# Patient Record
Sex: Male | Born: 1955 | Race: White | Hispanic: No | Marital: Married | Smoking: Former smoker
Health system: Southern US, Community
[De-identification: ages and names within clinical notes are randomized; demographics above are authoritative.]

## PROBLEM LIST (undated history)

## (undated) DIAGNOSIS — M4800 Spinal stenosis, site unspecified: Secondary | ICD-10-CM

## (undated) DIAGNOSIS — I4891 Unspecified atrial fibrillation: Secondary | ICD-10-CM

---

## 2018-09-18 ENCOUNTER — Encounter: Payer: Self-pay | Admitting: Emergency Medicine

## 2018-09-18 ENCOUNTER — Other Ambulatory Visit: Payer: Self-pay

## 2018-09-18 ENCOUNTER — Emergency Department
Admission: EM | Admit: 2018-09-18 | Discharge: 2018-09-18 | Disposition: A | Discharge status: Home / Self Care | Payer: BLUE CROSS/BLUE SHIELD | Attending: Emergency Medicine | Admitting: Emergency Medicine

## 2018-09-18 ENCOUNTER — Emergency Department: Payer: BLUE CROSS/BLUE SHIELD

## 2018-09-18 DIAGNOSIS — I480 Paroxysmal atrial fibrillation: Secondary | ICD-10-CM | POA: Insufficient documentation

## 2018-09-18 DIAGNOSIS — Z87891 Personal history of nicotine dependence: Secondary | ICD-10-CM | POA: Insufficient documentation

## 2018-09-18 DIAGNOSIS — I4891 Unspecified atrial fibrillation: Secondary | ICD-10-CM

## 2018-09-18 DIAGNOSIS — R002 Palpitations: Secondary | ICD-10-CM | POA: Diagnosis present

## 2018-09-18 HISTORY — DX: Unspecified atrial fibrillation: I48.91

## 2018-09-18 HISTORY — DX: Spinal stenosis, site unspecified: M48.00

## 2018-09-18 LAB — COMPREHENSIVE METABOLIC PANEL
ALT: 53 U/L — ABNORMAL HIGH (ref 0–44)
AST: 46 U/L — ABNORMAL HIGH (ref 15–41)
Albumin: 3.8 g/dL (ref 3.5–5.0)
Alkaline Phosphatase: 57 U/L (ref 38–126)
Anion gap: 8 (ref 5–15)
BUN: 16 mg/dL (ref 8–23)
CO2: 24 mmol/L (ref 22–32)
Calcium: 8.9 mg/dL (ref 8.9–10.3)
Chloride: 103 mmol/L (ref 98–111)
Creatinine, Ser: 1.2 mg/dL (ref 0.61–1.24)
GFR calc Af Amer: >60 mL/min (ref >60)
GFR calc non Af Amer: >60 mL/min (ref >60)
Glucose, Bld: 184 mg/dL — ABNORMAL HIGH (ref 70–99)
Potassium: 4.3 mmol/L (ref 3.5–5.1)
Sodium: 135 mmol/L (ref 135–145)
Total Bilirubin: 1 mg/dL (ref 0.3–1.2)
Total Protein: 7.2 g/dL (ref 6.5–8.1)

## 2018-09-18 LAB — CBC
HEMATOCRIT: 48.9 % (ref 39.0–52.0)
Hemoglobin: 16.6 g/dL (ref 13.0–17.0)
MCH: 29.4 pg (ref 26.0–34.0)
MCHC: 33.9 g/dL (ref 30.0–36.0)
MCV: 86.5 fL (ref 80.0–100.0)
Platelets: 206 10*3/uL (ref 150–400)
RBC: 5.65 MIL/uL (ref 4.22–5.81)
RDW: 13.2 % (ref 11.5–15.5)
WBC: 8.1 10*3/uL (ref 4.0–10.5)
nRBC: 0 % (ref 0.0–0.2)

## 2018-09-18 LAB — TROPONIN I: Troponin I: <0.03 ng/mL (ref <0.03)

## 2018-09-18 MED ORDER — APIXABAN 5 MG PO TABS: 5.0000 mg | ORAL_TABLET | Freq: Two times a day (BID) | ORAL | 0 refills | Status: AC

## 2018-09-18 NOTE — ED Triage Notes (Signed)
Palpitations x 2 days. Diagnosed with a fib x 2 years.

## 2018-09-18 NOTE — Discharge Instructions (Signed)
As we discussed please begin taking metoprolol 50 mg twice daily.  Please take your Eliquis as prescribed.  Please follow-up with Dr. Gwen PoundsKowalski in the office this week for recheck/reevaluation.  Return to the emergency department for any chest pain, trouble breathing, black or bloody stool, severe headache, or any other symptom personally concerning to yourself.

## 2018-09-18 NOTE — ED Provider Notes (Signed)
[facility] Emergency Department Provider Note  Time seen: 11:17 AM  I have reviewed the triage vital signs and the nursing notes.   HISTORY  Chief Complaint Palpitations    HPI Mathew Mann is a 62 y.o. male with a past medical history of proximal atrial fibrillation, spinal stenosis, presents to the emergency department for palpitations.  According to the patient for the past 2 years he has been diagnosed with paroxysmal atrial fibrillation currently not on any anticoagulation.  States over the past years he has gone into A. fib approximately 3 or 4 times.  Each time he goes into A. fib he is able to take an extra metoprolol and he comes out of A. fib within several hours per patient.  Patient states over the past 48 hours he has been in atrial fibrillation, he has taken his extra metoprolol but he has not come out of A. fib.  Patient was told if he is in A. fib for more than 48 hours he needs to go to the emergency department or see his doctor.  Unable to see his cardiologist, Dr. Gwen PoundsKowalski today so he came to the emergency department for evaluation.  Patient denies any chest pain or any shortness of breath.  No recent illnesses.  No new medications or over-the-counter medications.   Past Medical History:  Diagnosis Date  . Atrial fibrillation (HCC)   . Spinal stenosis     There are no active problems to display for this patient.   History reviewed. No pertinent surgical history.  Prior to Admission medications   Not on File    Allergies  Allergen Reactions  . Aspirin Other (See Comments)    Ulcerative colitis  . Ibuprofen Other (See Comments)    Ulcerative colitis  . Tylenol [Acetaminophen] Other (See Comments)    History of elevated liver enzymes    No family history on file.  Social History Social History   Tobacco Use  . Smoking status: Former Smoker  Substance Use Topics  . Alcohol use: Not on file  . Drug use: Not on file     Review of Systems Constitutional: Negative for fever Cardiovascular: Negative for chest pain.  Positive for palpitations. Respiratory: Negative for shortness of breath. Gastrointestinal: Negative for abdominal pain, vomiting Genitourinary: Negative for urinary compaints Musculoskeletal: Negative for musculoskeletal complaints Skin: Negative for skin complaints  Neurological: Negative for headache All other ROS negative  ____________________________________________   PHYSICAL EXAM:  VITAL SIGNS: ED Triage Vitals [09/18/18 1044]  Enc Vitals Group     BP 133/87     Pulse Rate 80     Resp 20     Temp 98.1 F (36.7 C)     Temp Source Oral     SpO2 96 %     Weight 250 lb (113.4 kg)     Height 6' (1.829 m)     Head Circumference      Peak Flow      Pain Score 0     Pain Loc      Pain Edu?      Excl. in GC?     Constitutional: Alert and oriented. Well appearing and in no distress. Eyes: Normal exam ENT   Head: Normocephalic and atraumatic.   Mouth/Throat: Mucous membranes are moist. Cardiovascular: Irregular rhythm, rate around 80 bpm.  No obvious murmur. Respiratory: Normal respiratory effort without tachypnea nor retractions. Breath sounds are clear  Gastrointestinal: Soft and nontender. No distention.   Musculoskeletal: Nontender  with normal range of motion in all extremities.  Neurologic:  Normal speech and language. No gross focal neurologic deficits  Skin:  Skin is warm, dry and intact.  Psychiatric: Mood and affect are normal.   ____________________________________________    EKG  EKG viewed and interpreted by myself shows atrial fibrillation at 72 bpm with a narrow QRS, normal axis, normal intervals, no concerning ST changes.  ____________________________________________    RADIOLOGY  X-rays negative  ____________________________________________   INITIAL IMPRESSION / ASSESSMENT AND PLAN / ED COURSE  Pertinent labs & imaging results that  were available during my care of the patient were reviewed by me and considered in my medical decision making (see chart for details).  Patient presents to the emergency department for palpitations ongoing for approximately 48 hours.  Patient is EKG is consistent with atrial fibrillation.  We will check labs and continue to closely monitor.  Once lab results are known we will discuss with cardiology for further recommendations.  Overall the patient appears extremely well, no distress with otherwise reassuring vitals.  Labs are reassuring, troponin negative, x-ray negative.  I discussed the patient with Dr. Gwen PoundsKowalski he recommends doubling the metoprolol from 25 twice daily to 50 mg twice daily as well as starting the patient on Eliquis 5 mg twice daily and he will see in the office this week.  I discussed this with the patient he is agreeable to this plan of care.  ____________________________________________   FINAL CLINICAL IMPRESSION(S) / ED DIAGNOSES  Paroxysmal atrial fibrillation    Minna AntisPaduchowski, Lafawn Lenoir, MD 09/18/18 1223

## 2020-02-28 IMAGING — DX DG CHEST 1V PORT
1 series · 1 of 1 positions shown · non-contrast
Comparison: None.

CLINICAL DATA: Cardiac palpitations for 2 days.

EXAM:
PORTABLE CHEST 1 VIEW

[chest ap]
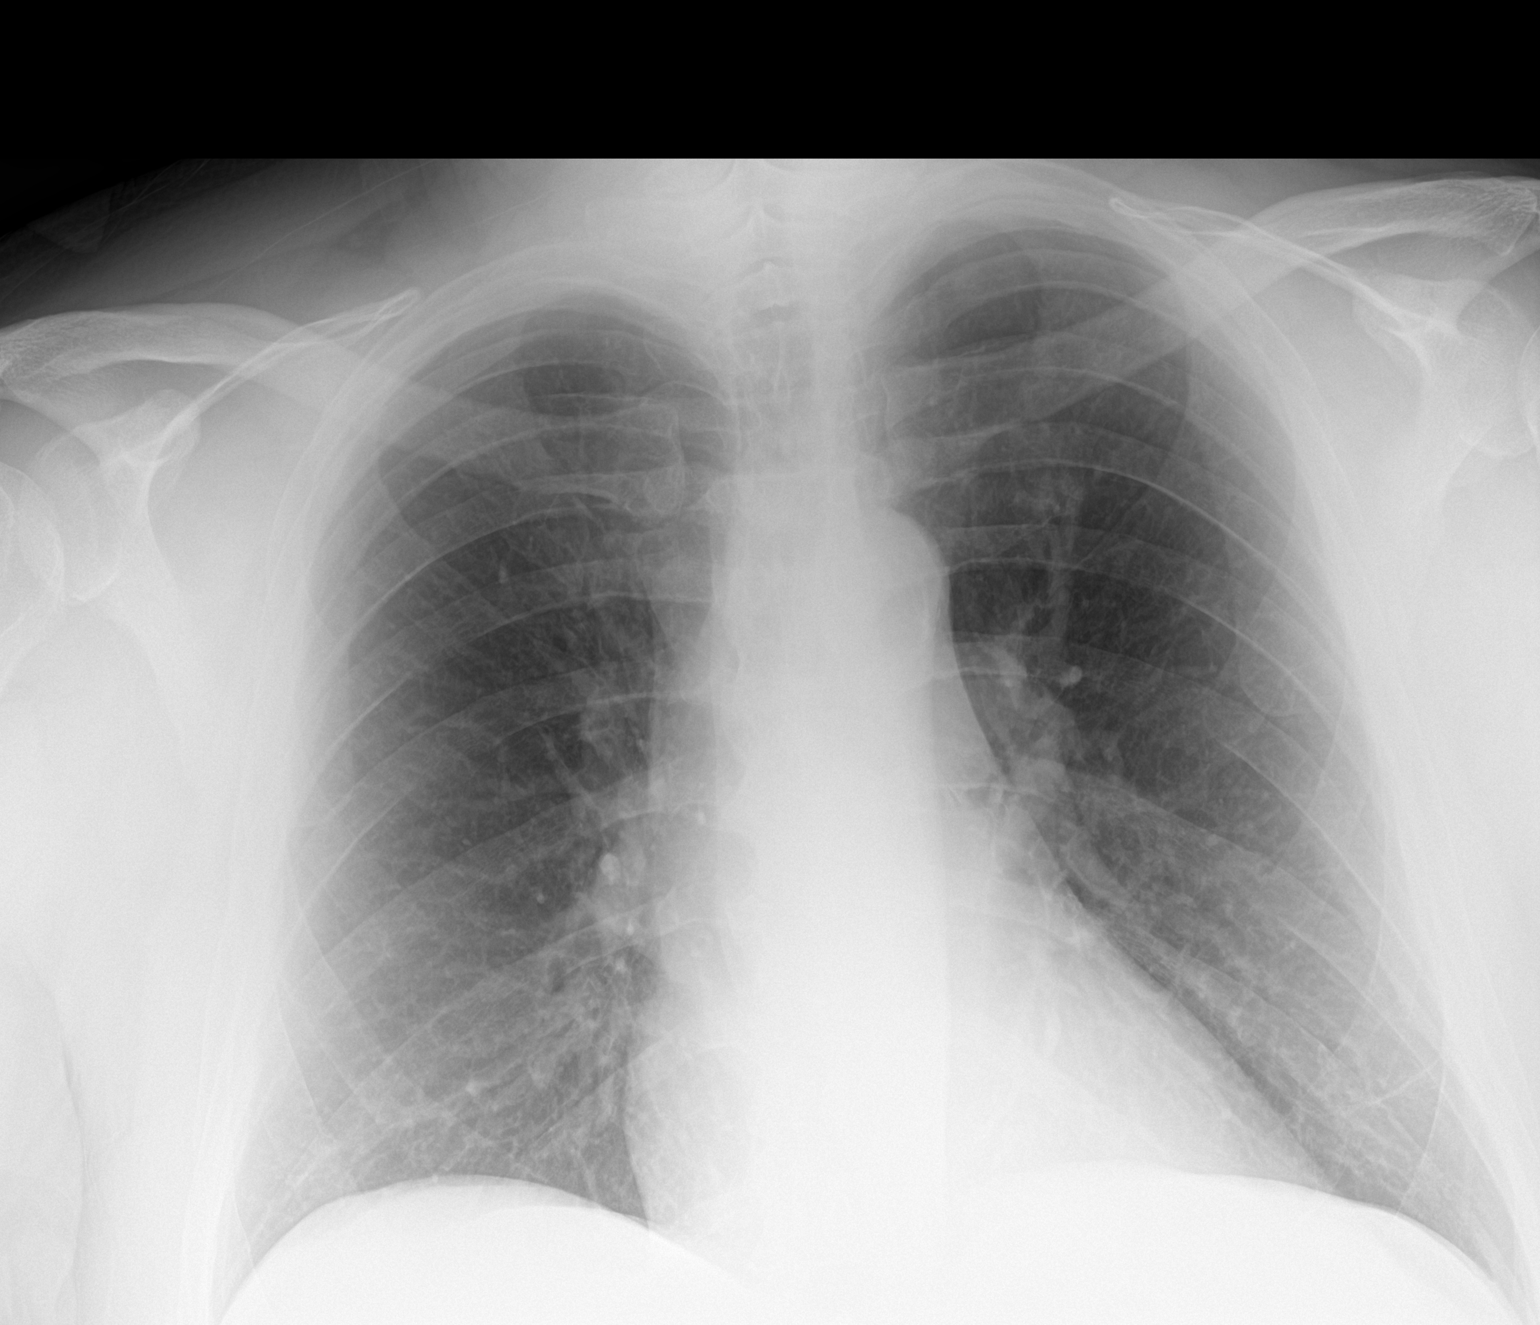

[1 of 1 positions shown; findings below may reference images not displayed]

FINDINGS: Atherosclerotic calcification of the aortic arch. Thoracic
spondylosis. The lungs appear clear. Heart size within normal limits
for projection.
IMPRESSION: 1. No acute findings.
2.  Aortic Atherosclerosis ([id number]).
3. Thoracic spondylosis.
